# Patient Record
Sex: Male | Born: 2005
Health system: Southern US, Community
[De-identification: ages and names within clinical notes are randomized; demographics above are authoritative.]

## PROBLEM LIST (undated history)

## (undated) ENCOUNTER — Ambulatory Visit: Admission: EM | Payer: 59 | Source: Home / Self Care

## (undated) DIAGNOSIS — J45909 Unspecified asthma, uncomplicated: Secondary | ICD-10-CM

## (undated) DIAGNOSIS — F909 Attention-deficit hyperactivity disorder, unspecified type: Secondary | ICD-10-CM

## (undated) HISTORY — PX: NO PAST SURGERIES: SHX2092

## (undated) HISTORY — DX: Attention-deficit hyperactivity disorder, unspecified type: F90.9

---
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
## 2023-01-07 ENCOUNTER — Other Ambulatory Visit (HOSPITAL_COMMUNITY): Payer: Self-pay

## 2023-04-17 DIAGNOSIS — T1501XA Foreign body in cornea, right eye, initial encounter: Secondary | ICD-10-CM | POA: Diagnosis not present

## 2023-04-17 DIAGNOSIS — F17290 Nicotine dependence, other tobacco product, uncomplicated: Secondary | ICD-10-CM | POA: Diagnosis not present

## 2023-04-22 DIAGNOSIS — S71142A Puncture wound with foreign body, left thigh, initial encounter: Secondary | ICD-10-CM | POA: Diagnosis not present

## 2023-04-26 DIAGNOSIS — Z510 Encounter for antineoplastic radiation therapy: Secondary | ICD-10-CM | POA: Diagnosis not present

## 2023-11-18 ENCOUNTER — Other Ambulatory Visit (HOSPITAL_COMMUNITY): Payer: Self-pay

## 2023-11-18 MED ORDER — ALBUTEROL SULFATE HFA 108 (90 BASE) MCG/ACT IN AERS
2.0000 | INHALATION_SPRAY | RESPIRATORY_TRACT | 0 refills | Status: AC
  Filled 2023-11-18: qty 6.7, 17d supply, fill #0
  Filled 2024-01-07: qty 6.7, 20d supply, fill #0

## 2023-11-18 MED ORDER — HYDROCORTISONE 2.5 % EX CREA
1.0000 | TOPICAL_CREAM | Freq: Every day | CUTANEOUS | 3 refills | Status: AC | PRN
  Filled 2023-11-18: qty 60, 30d supply, fill #0

## 2023-11-18 MED ORDER — CETIRIZINE HCL 10 MG PO TABS
10.0000 mg | ORAL_TABLET | Freq: Every day | ORAL | 5 refills | Status: AC
  Filled 2023-11-18 – 2024-01-07 (×2): qty 30, 30d supply, fill #0

## 2023-11-18 MED ORDER — MONTELUKAST SODIUM 10 MG PO TABS
10.0000 mg | ORAL_TABLET | Freq: Every day | ORAL | 5 refills | Status: AC
  Filled 2023-11-18 – 2024-01-07 (×2): qty 30, 30d supply, fill #0

## 2023-11-18 MED ORDER — EPINEPHRINE 0.3 MG/0.3ML IJ SOAJ
INTRAMUSCULAR | 1 refills | Status: AC
  Filled 2023-11-18: qty 2, 30d supply, fill #0
  Filled 2024-01-07: qty 2, 2d supply, fill #0

## 2023-11-18 MED ORDER — MOMETASONE FUROATE 50 MCG/ACT NA SUSP
2.0000 | Freq: Every day | NASAL | 5 refills | Status: AC
  Filled 2023-11-18 – 2024-01-07 (×2): qty 17, 30d supply, fill #0

## 2023-12-01 ENCOUNTER — Other Ambulatory Visit (HOSPITAL_COMMUNITY): Payer: Self-pay

## 2023-12-09 ENCOUNTER — Other Ambulatory Visit (HOSPITAL_COMMUNITY): Payer: Self-pay

## 2023-12-09 MED ORDER — DOXYCYCLINE HYCLATE 100 MG PO CAPS
100.0000 mg | ORAL_CAPSULE | Freq: Two times a day (BID) | ORAL | 0 refills | Status: AC
  Filled 2023-12-09: qty 14, 7d supply, fill #0

## 2023-12-10 ENCOUNTER — Other Ambulatory Visit (HOSPITAL_COMMUNITY): Payer: Self-pay

## 2024-01-07 ENCOUNTER — Other Ambulatory Visit (HOSPITAL_COMMUNITY): Payer: Self-pay
